# Patient Record
Sex: Female | Born: 1995
Health system: Southern US, Community
[De-identification: ages and names within clinical notes are randomized; demographics above are authoritative.]

## PROBLEM LIST (undated history)

## (undated) DIAGNOSIS — F418 Other specified anxiety disorders: Secondary | ICD-10-CM

## (undated) DIAGNOSIS — M357 Hypermobility syndrome: Secondary | ICD-10-CM

## (undated) DIAGNOSIS — G43109 Migraine with aura, not intractable, without status migrainosus: Secondary | ICD-10-CM

## (undated) DIAGNOSIS — M6751 Plica syndrome, right knee: Secondary | ICD-10-CM

## (undated) DIAGNOSIS — L700 Acne vulgaris: Secondary | ICD-10-CM

## (undated) DIAGNOSIS — N911 Secondary amenorrhea: Secondary | ICD-10-CM

## (undated) HISTORY — DX: Acne vulgaris: L70.0

## (undated) HISTORY — DX: Migraine with aura, not intractable, without status migrainosus: G43.109

## (undated) HISTORY — DX: Secondary amenorrhea: N91.1

## (undated) HISTORY — DX: Plica syndrome, right knee: M67.51

## (undated) HISTORY — DX: Other specified anxiety disorders: F41.8

## (undated) HISTORY — DX: Hypermobility syndrome: M35.7

---
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
## 2017-02-12 ENCOUNTER — Other Ambulatory Visit: Payer: Self-pay | Admitting: Physician Assistant

## 2017-02-12 MED ORDER — NORETHINDRONE 0.35 MG PO TABS: 1.0000 | ORAL_TABLET | Freq: Every day | ORAL | 3 refills | Status: DC

## 2017-02-12 MED ORDER — TRETINOIN MICROSPHERE 0.04 % EX GEL: Freq: Every day | CUTANEOUS | 1 refills | Status: DC

## 2017-05-12 DIAGNOSIS — N3001 Acute cystitis with hematuria: Secondary | ICD-10-CM | POA: Diagnosis not present

## 2017-05-13 ENCOUNTER — Other Ambulatory Visit: Payer: Self-pay | Admitting: Physician Assistant

## 2017-05-13 MED ORDER — PROMETHAZINE HCL 25 MG PO TABS: 25.0000 mg | ORAL_TABLET | Freq: Three times a day (TID) | ORAL | 1 refills | Status: DC | PRN

## 2017-06-29 DIAGNOSIS — N912 Amenorrhea, unspecified: Secondary | ICD-10-CM | POA: Diagnosis not present

## 2017-06-29 DIAGNOSIS — L709 Acne, unspecified: Secondary | ICD-10-CM | POA: Diagnosis not present

## 2017-07-06 DIAGNOSIS — Z30430 Encounter for insertion of intrauterine contraceptive device: Secondary | ICD-10-CM | POA: Diagnosis not present

## 2017-07-31 DIAGNOSIS — M545 Low back pain: Secondary | ICD-10-CM | POA: Diagnosis not present

## 2017-07-31 DIAGNOSIS — M25551 Pain in right hip: Secondary | ICD-10-CM | POA: Diagnosis not present

## 2017-09-07 ENCOUNTER — Ambulatory Visit: Payer: 59 | Admitting: Family Medicine

## 2017-09-07 ENCOUNTER — Ambulatory Visit (INDEPENDENT_AMBULATORY_CARE_PROVIDER_SITE_OTHER): Payer: 59

## 2017-09-07 ENCOUNTER — Encounter: Payer: Self-pay | Admitting: Family Medicine

## 2017-09-07 VITALS — BP 110/60 | HR 92 | Temp 98.6°F | Ht 62.0 in | Wt 108.4 lb

## 2017-09-07 DIAGNOSIS — N912 Amenorrhea, unspecified: Secondary | ICD-10-CM | POA: Diagnosis not present

## 2017-09-07 DIAGNOSIS — M25551 Pain in right hip: Secondary | ICD-10-CM

## 2017-09-07 DIAGNOSIS — E559 Vitamin D deficiency, unspecified: Secondary | ICD-10-CM

## 2017-09-07 DIAGNOSIS — M2550 Pain in unspecified joint: Secondary | ICD-10-CM

## 2017-09-07 DIAGNOSIS — M25552 Pain in left hip: Secondary | ICD-10-CM | POA: Diagnosis not present

## 2017-09-07 DIAGNOSIS — R5383 Other fatigue: Secondary | ICD-10-CM | POA: Diagnosis not present

## 2017-09-07 DIAGNOSIS — M25561 Pain in right knee: Secondary | ICD-10-CM | POA: Diagnosis not present

## 2017-09-07 LAB — POCT URINE PREGNANCY: Preg Test, Ur: NEGATIVE

## 2017-09-07 MED ORDER — MUPIROCIN CALCIUM 2 % EX CREA: TOPICAL_CREAM | Freq: Two times a day (BID) | CUTANEOUS | Status: AC

## 2017-09-07 NOTE — Progress Notes (Signed)
Leslie French is a 22 y.o. female here for an acute visit.  History of Present Illness:   Leslie French, CMA acting as scribe for Dr. Briscoe Deutscher.   HPI:She is in school in [place] and is home for the summer. She in the office with her mom who is a PA. Dad is a Tax adviser. Mom is concerned because she is not a big complainer.    Patient in office for evaluation of joint pain. Started around age 84 with knee pian has been evaluated in the past. It has spread all over body. Pain is in bilateral knee hip and ankle pain. With Right grater than left. Her right side hurts when she wakes up every day and left side starts as she moves during the day. She has been a runner in the past but pain has gotten to the point where she can't even walk a mile. The pain got to a point where it effecting daily activities about a year ago. When she was seen by sports medicine was told that she had hypermobility and it should not get much worse. She has not had any imaging, PT , or blood work done. She did have ultrasound done by sports medications. She has noticed that tight bracing helps a small amount. She has not noticed any swelling but does feel like she has had more accidents due to stability issues.   She was super active in the past but now she does not feel like she can even go on short walk. No rashes, or tick bites. She has had some trouble sleeping and increased fatigue. She is not able to stay in one position for more than 30 minutes at a time. It wakes her up at night.   She has not had changes in social habits. No drugs or alcohol. No family history arthritis issues. She has had history of migraines and abnormal menstrual cycle with problems with sleep disturbances.   She does have follow up with sports medicine next week for orthotics.   PMHx, SurgHx, SocialHx, Medications, and Allergies were reviewed in the Visit Navigator and updated as appropriate.  Current Medications:   Current Outpatient  Medications:  .  ibuprofen (ADVIL,MOTRIN) 200 MG tablet, Take 200 mg by mouth every 6 (six) hours as needed., Disp: , Rfl:   Current Facility-Administered Medications:  .  mupirocin cream (BACTROBAN) 2 %, , Topical, BID, Briscoe Deutscher, DO   Allergies  Allergen Reactions  . Cephalosporins Hives    As a child  . Amoxicillin Hives  . Cefdinir Hives and Swelling  . Penicillins Hives   Review of Systems:   Pertinent items are noted in the HPI. Otherwise, ROS is negative.  Vitals:   Vitals:   09/07/17 1453  BP: 110/60  Pulse: 92  Temp: 98.6 F (37 C)  TempSrc: Oral  SpO2: 99%  Weight: 108 lb 6.4 oz (49.2 kg)  Height: 5\' 2"  (1.575 m)     Body mass index is 19.83 kg/m.  Physical Exam:   Physical Exam  Constitutional: She appears well-nourished.  HENT:  Head: Normocephalic and atraumatic.  Eyes: Pupils are equal, round, and reactive to light. EOM are normal.  Neck: Normal range of motion. Neck supple.  Cardiovascular: Normal rate, regular rhythm, normal heart sounds and intact distal pulses.  Pulmonary/Chest: Effort normal.  Abdominal: Soft.  Skin: Skin is warm.  Psychiatric: She has a normal mood and affect. Her behavior is normal.  Nursing note and vitals reviewed.  Results for orders placed or performed in visit on 09/07/17  ANA  Result Value Ref Range   Anti Nuclear Antibody(ANA) NEGATIVE NEGATIVE  Sedimentation rate  Result Value Ref Range   Sed Rate 3 0 - 20 mm/hr  Cyclic citrul peptide antibody, IgG  Result Value Ref Range   Cyclic Citrullin Peptide Ab <16 UNITS  VITAMIN D 25 Hydroxy (Vit-D Deficiency, Fractures)  Result Value Ref Range   VITD 36.40 30.00 - 100.00 ng/mL  CBC with Differential/Platelet  Result Value Ref Range   WBC 5.8 4.5 - 13.5 K/uL   RBC 4.14 3.80 - 5.70 Mil/uL   Hemoglobin 12.8 12.0 - 16.0 g/dL   HCT 37.8 36.0 - 49.0 %   MCV 91.4 78.0 - 98.0 fl   MCHC 33.9 31.0 - 37.0 g/dL   RDW 12.3 11.4 - 15.5 %   Platelets 207.0 150.0 -  575.0 K/uL   Neutrophils Relative % 60.6 43.0 - 71.0 %   Lymphocytes Relative 32.5 24.0 - 48.0 %   Monocytes Relative 5.3 3.0 - 12.0 %   Eosinophils Relative 1.1 0.0 - 5.0 %   Basophils Relative 0.5 0.0 - 3.0 %   Neutro Abs 3.5 1.4 - 7.7 K/uL   Lymphs Abs 1.9 0.7 - 4.0 K/uL   Monocytes Absolute 0.3 0.1 - 1.0 K/uL   Eosinophils Absolute 0.1 0.0 - 0.7 K/uL   Basophils Absolute 0.0 0.0 - 0.1 K/uL  Comprehensive metabolic panel  Result Value Ref Range   Sodium 142 135 - 145 mEq/L   Potassium 4.2 3.5 - 5.1 mEq/L   Chloride 105 96 - 112 mEq/L   CO2 30 19 - 32 mEq/L   Glucose, Bld 97 70 - 99 mg/dL   BUN 9 6 - 23 mg/dL   Creatinine, Ser 0.60 0.40 - 1.20 mg/dL   Total Bilirubin 0.4 0.2 - 1.2 mg/dL   Alkaline Phosphatase 74 47 - 119 U/L   AST 14 0 - 37 U/L   ALT 13 0 - 35 U/L   Total Protein 6.7 6.0 - 8.3 g/dL   Albumin 4.5 3.5 - 5.2 g/dL   Calcium 9.3 8.4 - 10.5 mg/dL   GFR 135.57 >60.00 mL/min  TSH  Result Value Ref Range   TSH 1.21 0.40 - 5.00 uIU/mL  Vitamin B12  Result Value Ref Range   Vitamin B-12 599 211 - 911 pg/mL  Rheumatoid factor  Result Value Ref Range   Rhuematoid fact SerPl-aCnc <14 <14 IU/mL  C-reactive protein  Result Value Ref Range   CRP <0.1 (L) 0.5 - 20.0 mg/dL  POCT urine pregnancy  Result Value Ref Range   Preg Test, Ur Negative Negative   Assessment and Plan:   Leslie French was seen today for leg pain.  Diagnoses and all orders for this visit:  Arthralgia, unspecified joint -     ANA -     Sedimentation rate -     Cyclic citrul peptide antibody, IgG -     Cancel: DG HIP UNILAT W OR W/O PELVIS 2-3 VIEWS LEFT; Future -     VITAMIN D 25 Hydroxy (Vit-D Deficiency, Fractures) -     DG Knee 1-2 Views Right; Future -     mupirocin cream (BACTROBAN) 2 % -     CBC with Differential/Platelet -     Comprehensive metabolic panel -     TSH -     Vitamin B12 -     Rheumatoid factor -     C-reactive  protein  Other fatigue -     ANA -     Sedimentation  rate -     Cyclic citrul peptide antibody, IgG -     CBC with Differential/Platelet -     Comprehensive metabolic panel -     TSH -     Vitamin B12  Vitamin D deficiency -     VITAMIN D 25 Hydroxy (Vit-D Deficiency, Fractures)  Amenorrhea -     POCT urine pregnancy  Right hip pain -     DG HIP UNILAT W OR W/O PELVIS 2-3 VIEWS RIGHT   . Reviewed expectations re: course of current medical issues. . Discussed self-management of symptoms. . Outlined signs and symptoms indicating need for more acute intervention. . Patient verbalized understanding and all questions were answered. Marland Kitchen Health Maintenance issues including appropriate healthy diet, exercise, and smoking avoidance were discussed with patient. . See orders for this visit as documented in the electronic medical record. . Patient received an After Visit Summary.  CMA served as Education administrator during this visit. History, Physical, and Plan performed by medical provider. The above documentation has been reviewed and is accurate and complete. Briscoe Deutscher, D.O.  Briscoe Deutscher, DO [place], [facility] 09/10/2017

## 2017-09-08 LAB — COMPREHENSIVE METABOLIC PANEL
ALT: 13 U/L (ref 0–35)
AST: 14 U/L (ref 0–37)
Albumin: 4.5 g/dL (ref 3.5–5.2)
Alkaline Phosphatase: 74 U/L (ref 47–119)
BUN: 9 mg/dL (ref 6–23)
CO2: 30 mEq/L (ref 19–32)
Calcium: 9.3 mg/dL (ref 8.4–10.5)
Chloride: 105 mEq/L (ref 96–112)
Creatinine, Ser: 0.6 mg/dL (ref 0.40–1.20)
GFR: 135.57 mL/min (ref >60.00)
Glucose, Bld: 97 mg/dL (ref 70–99)
Potassium: 4.2 mEq/L (ref 3.5–5.1)
Sodium: 142 mEq/L (ref 135–145)
Total Bilirubin: 0.4 mg/dL (ref 0.2–1.2)
Total Protein: 6.7 g/dL (ref 6.0–8.3)

## 2017-09-08 LAB — CBC WITH DIFFERENTIAL/PLATELET
Basophils Absolute: 0 10*3/uL (ref 0.0–0.1)
Basophils Relative: 0.5 % (ref 0.0–3.0)
Eosinophils Absolute: 0.1 10*3/uL (ref 0.0–0.7)
Eosinophils Relative: 1.1 % (ref 0.0–5.0)
HCT: 37.8 % (ref 36.0–49.0)
Hemoglobin: 12.8 g/dL (ref 12.0–16.0)
Lymphocytes Relative: 32.5 % (ref 24.0–48.0)
Lymphs Abs: 1.9 10*3/uL (ref 0.7–4.0)
MCHC: 33.9 g/dL (ref 31.0–37.0)
MCV: 91.4 fl (ref 78.0–98.0)
Monocytes Absolute: 0.3 10*3/uL (ref 0.1–1.0)
Monocytes Relative: 5.3 % (ref 3.0–12.0)
Neutro Abs: 3.5 10*3/uL (ref 1.4–7.7)
Neutrophils Relative %: 60.6 % (ref 43.0–71.0)
Platelets: 207 10*3/uL (ref 150.0–575.0)
RBC: 4.14 Mil/uL (ref 3.80–5.70)
RDW: 12.3 % (ref 11.4–15.5)
WBC: 5.8 10*3/uL (ref 4.5–13.5)

## 2017-09-08 LAB — VITAMIN B12: Vitamin B-12: 599 pg/mL (ref 211–911)

## 2017-09-08 LAB — VITAMIN D 25 HYDROXY (VIT D DEFICIENCY, FRACTURES): VITD: 36.4 ng/mL (ref 30.00–100.00)

## 2017-09-08 LAB — TSH: TSH: 1.21 u[IU]/mL (ref 0.40–5.00)

## 2017-09-08 LAB — C-REACTIVE PROTEIN: CRP: <0.1 mg/dL — ABNORMAL LOW (ref 0.5–20.0)

## 2017-09-08 LAB — SEDIMENTATION RATE: Sed Rate: 3 mm/hr (ref 0–20)

## 2017-09-09 LAB — ANA: Anti Nuclear Antibody(ANA): NEGATIVE

## 2017-09-09 LAB — RHEUMATOID FACTOR: Rhuematoid fact SerPl-aCnc: <14 IU/mL (ref <14)

## 2017-09-09 LAB — CYCLIC CITRUL PEPTIDE ANTIBODY, IGG: Cyclic Citrullin Peptide Ab: <16 UNITS

## 2017-09-10 ENCOUNTER — Encounter: Payer: Self-pay | Admitting: Family Medicine

## 2017-09-14 ENCOUNTER — Ambulatory Visit: Payer: 59 | Admitting: Sports Medicine

## 2017-09-14 ENCOUNTER — Encounter: Payer: Self-pay | Admitting: Sports Medicine

## 2017-09-14 VITALS — BP 94/66 | Ht 62.0 in | Wt 110.0 lb

## 2017-09-14 DIAGNOSIS — M25551 Pain in right hip: Secondary | ICD-10-CM | POA: Diagnosis not present

## 2017-09-14 DIAGNOSIS — G8929 Other chronic pain: Secondary | ICD-10-CM | POA: Diagnosis not present

## 2017-09-14 DIAGNOSIS — M25561 Pain in right knee: Secondary | ICD-10-CM

## 2017-09-14 DIAGNOSIS — M25552 Pain in left hip: Secondary | ICD-10-CM

## 2017-09-14 MED ORDER — MUPIROCIN 2 % EX OINT: 1.0000 "application " | TOPICAL_OINTMENT | Freq: Two times a day (BID) | CUTANEOUS | 0 refills | Status: DC

## 2017-09-14 MED FILL — MUPIROCIN 2% OINTMENT: 2 | 10 days supply | Qty: 22 | Fill #0

## 2017-09-14 NOTE — Progress Notes (Signed)
   Subjective:    Patient ID: Leslie French, female    DOB: [birth year], 22 y.o.   MRN: [id number]  HPI chief complaint: Bilateral hip and knee pain  Very pleasant 22 year old female comes in today complaining of long-standing bilateral hip and knee pain. She's been diagnosed previously with hypermobility. She was last seen in the office by Dr. Oneida Alar in 2017 for right knee pain. She was given comprehensive home exercises at that time but her knee pain never improved. In fact, she used to be an avid runner but has had to stop running because of her pain. She now endorses pain with simple walking. Pain is in both hips and both knees. She saw her PCP recently and had blood work done to rule out systemic arthropathy. She also had x-rays of her knee and right hip. In regards to hip pain, she endorses pain throughout the hip with activity. Her knee pain is diffuse. She has not noticed any swelling. No surgery to either hip or knee in the past. She takes Motrin occasionally as needed. Otherwise is healthy. She is here today with her mom.  Interim medical history reviewed Medications reviewed Allergies reviewed    Review of Systems As above    Objective:   Physical Exam  Well-developed, well-nourished. No acute distress. Awake alert and oriented 3. Vital signs reviewed.  Examination of both hips shows smooth painless hip range of motion with a positive FADIR bilaterally, right greater than left. Positive "C" sign bilaterally. No tenderness to palpation. Fairly good strength.  Examination of both knees shows full range of motion. No obvious effusion. Good patellar mobility. No joint line tenderness. Good ligament stability.  Neurovascular intact distally.  Evaluation of her gait shows slight pronation.  X-rays of her right knee and right hip show nothing acute. 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 Given the chronicity of the right knee pain and normal x-rays I do think that it is reasonable to get an MRI to rule out an osteochondral lesion. In the meantime, we've given her some hip and knee exercises to start doing at home. I strongly recommended that she try Pilates. She lives in [place] but I gave her [place] card as a reference. Since she has slight pronation, we will fit her with some green sports insoles with scaphoid pads. I will call her with MRI results when available. Although she may have femoral acetabular impingement, I believe that this is likely secondary to hypermobility and I've recommended against aggressive workup or treatment for this at this point in time.

## 2017-09-18 ENCOUNTER — Ambulatory Visit
Admission: RE | Admit: 2017-09-18 | Discharge: 2017-09-18 | Disposition: A | Discharge status: Home / Self Care | Payer: 59 | Source: Ambulatory Visit | Attending: Sports Medicine | Admitting: Sports Medicine

## 2017-09-18 DIAGNOSIS — M25561 Pain in right knee: Secondary | ICD-10-CM | POA: Diagnosis not present

## 2017-09-18 DIAGNOSIS — G8929 Other chronic pain: Secondary | ICD-10-CM

## 2017-09-25 ENCOUNTER — Telehealth: Payer: Self-pay | Admitting: Sports Medicine

## 2017-09-25 NOTE — Telephone Encounter (Signed)
  I spoke with Leslie French on the phone today after reviewing the MRI of her right knee. She does have a markedly thickened and prominent medial plica but no evidence of chondromalacia patella.This may be an incidental finding as most of her symptoms appear to be retropatellar. I've recommended that she stick with her home exercises for now and follow-up with me when she returns home from college sometime this fall. If she fails to improve with strengthening of her hip and knee then we may need to consider surgical consultation to discuss merits of medial plica excision.

## 2017-11-30 ENCOUNTER — Ambulatory Visit: Payer: 59 | Admitting: Sports Medicine

## 2017-12-01 DIAGNOSIS — M6751 Plica syndrome, right knee: Secondary | ICD-10-CM

## 2017-12-01 DIAGNOSIS — G8929 Other chronic pain: Secondary | ICD-10-CM | POA: Diagnosis not present

## 2017-12-01 HISTORY — DX: Plica syndrome, right knee: M67.51

## 2017-12-02 DIAGNOSIS — Z23 Encounter for immunization: Secondary | ICD-10-CM | POA: Diagnosis not present

## 2017-12-02 DIAGNOSIS — Z87440 Personal history of urinary (tract) infections: Secondary | ICD-10-CM | POA: Diagnosis not present

## 2017-12-02 DIAGNOSIS — R319 Hematuria, unspecified: Secondary | ICD-10-CM | POA: Diagnosis not present

## 2017-12-10 ENCOUNTER — Ambulatory Visit: Payer: 59 | Admitting: Sports Medicine

## 2017-12-10 ENCOUNTER — Encounter: Payer: Self-pay | Admitting: Sports Medicine

## 2017-12-10 DIAGNOSIS — M6751 Plica syndrome, right knee: Secondary | ICD-10-CM

## 2017-12-10 NOTE — Assessment & Plan Note (Signed)
Patient had MRI which confirmed a thickened plica. She has been to see Dr. Ronnie Derby who recommended surgical release and removal of the ligament to help alleviate her knee pain. Given her hypermobility, we did discuss the risk of developing patella dislocation however she has never dislocated her patella before and given her continued pain which is now causing hip pain it is reasonable to pursue surgery as an option.  Patient will return to the clinic if her condition does not improve or if she has develops new symptoms. - Hip Adductor and Abductor stretches, Piriformis stretches, and hip flexor and extensor exercises

## 2017-12-10 NOTE — Progress Notes (Signed)
     Subjective:  HPI: Leslie French is a 22 y.o. presenting to clinic today to discuss the following:  Right Knee Pain Patient returns to clinic for follow up of right knee pain. Patient states her pain still continues and she has developed associated pain of the right hip. She states she has seen Dr. Ronnie Derby who recommended surgical release and removal of the plica. She had an MRI and is here to discuss those results and options for her going forward.  ROS no locking or giving way; no swelling of knee;  Sharp pain under sup medial patella  Past Medical, Surgical, Social, and Family History Reviewed & Updated per EMR.   Pertinent Historical Findings include:   Social History   Tobacco Use  Smoking Status Never Smoker  Smokeless Tobacco Never Used    Objective: BP 98/62   Ht 5\' 2"  (1.575 m)   Wt 110 lb (49.9 kg)   BMI 20.12 kg/m  Vitals and nursing notes reviewed  Physical Exam Gen: NAD, comfortable in exam room MSK: Beighton score of 5 Right Hip: No gross deformities, TTP in the piriformis region, pain on internal and external rotation, increased range of motion in flexion, extension, internal and external rotation, adduction and abduction. Negative FABER and FADIR test. Negative straight leg raise test. 5/5 strength. Gross sensation intact Left Hip: No gross deformities, TTP in the piriformis region, pain on internal and external rotation, increased range of motion in flexion, extension, internal and external rotation, adduction and abduction. Negative FABER and FADIR test. Negative straight leg raise test. 5/5 strength. Gross sensation intact. Right Knee: No gross deformities, non-tender to palpation, FROM painful when moving in flexion and internal rotation, 5/5 strength, gross sensation intact.  She has hypermobility but patella does not sublux on exam; no apprehension sign.  [place] screen confirms thick plica with no swelling noted at MPFL  Assessment/Plan:  Plica of  knee, right Patient had MRI which confirmed a thickened plica. She has been to see Dr. Ronnie Derby who recommended surgical release and removal of the ligament to help alleviate her knee pain. Given her hypermobility, we did discuss the risk of developing patella dislocation however she has never dislocated her patella before and given her continued pain which is now causing hip pain it is reasonable to pursue surgery as an option.  Patient will return to the clinic if her condition does not improve or if she has develops new symptoms. - Hip Adductor and Abductor stretches, Piriformis stretches, and hip flexor and extensor exercises   PATIENT EDUCATION PROVIDED: See AVS    Diagnosis and plan along with any newly prescribed medication(s) were discussed in detail with this patient today. The patient verbalized understanding and agreed with the plan. Patient advised if symptoms worsen return to clinic or ER.    Harolyn Rutherford, DO 12/10/2017, 4:35 PM PGY-2 [facility] I observed and examined the patient with the resident and agree with assessment and plan.  Note reviewed and modified by me. Stefanie Libel, MD

## 2017-12-10 NOTE — Patient Instructions (Signed)
It was great to meet you today! Thank you for letting me participate in your care!  Today, we discussed your continued right knee pain that is due to a thickened ligament in your knee joint called the medial patellofemoral ligament. Given your continued pain and the results of your MRI scan it is reasonable to pursue surgery at this time. We will also provide you with some hip exercises to help reduce your hip pain which is in part due to a change in your activity due to your knee pain. We wish you the best going forward in surgery. Please return to the clinic if you have any concerns or your condition does not improve.  Be well, Harolyn Rutherford, DO PGY-2, [facility]

## 2018-01-17 HISTORY — PX: OTHER SURGICAL HISTORY: SHX169

## 2018-01-27 DIAGNOSIS — G8918 Other acute postprocedural pain: Secondary | ICD-10-CM | POA: Diagnosis not present

## 2018-01-27 DIAGNOSIS — M6751 Plica syndrome, right knee: Secondary | ICD-10-CM | POA: Diagnosis not present

## 2018-01-27 DIAGNOSIS — M659 Synovitis and tenosynovitis, unspecified: Secondary | ICD-10-CM | POA: Diagnosis not present

## 2018-02-02 DIAGNOSIS — M25561 Pain in right knee: Secondary | ICD-10-CM | POA: Diagnosis not present

## 2018-02-02 DIAGNOSIS — Z9889 Other specified postprocedural states: Secondary | ICD-10-CM | POA: Diagnosis not present

## 2018-02-02 DIAGNOSIS — R262 Difficulty in walking, not elsewhere classified: Secondary | ICD-10-CM | POA: Diagnosis not present

## 2018-02-02 DIAGNOSIS — R29898 Other symptoms and signs involving the musculoskeletal system: Secondary | ICD-10-CM | POA: Diagnosis not present

## 2018-02-02 DIAGNOSIS — G8929 Other chronic pain: Secondary | ICD-10-CM | POA: Diagnosis not present

## 2018-02-02 DIAGNOSIS — M6751 Plica syndrome, right knee: Secondary | ICD-10-CM | POA: Diagnosis not present

## 2018-02-15 ENCOUNTER — Other Ambulatory Visit: Payer: Self-pay | Admitting: Sports Medicine

## 2018-02-18 MED FILL — MUPIROCIN 2% OINTMENT: 2 | 5 days supply | Qty: 22 | Fill #0

## 2018-04-26 ENCOUNTER — Encounter: Payer: Self-pay | Admitting: Podiatry

## 2018-04-26 ENCOUNTER — Other Ambulatory Visit: Payer: Self-pay | Admitting: Podiatry

## 2018-04-26 ENCOUNTER — Ambulatory Visit (INDEPENDENT_AMBULATORY_CARE_PROVIDER_SITE_OTHER): Payer: 59

## 2018-04-26 ENCOUNTER — Ambulatory Visit: Payer: 59 | Admitting: Podiatry

## 2018-04-26 VITALS — BP 87/54

## 2018-04-26 DIAGNOSIS — M2010 Hallux valgus (acquired), unspecified foot: Secondary | ICD-10-CM

## 2018-04-26 DIAGNOSIS — M2011 Hallux valgus (acquired), right foot: Secondary | ICD-10-CM

## 2018-04-26 DIAGNOSIS — L600 Ingrowing nail: Secondary | ICD-10-CM

## 2018-04-26 DIAGNOSIS — M21619 Bunion of unspecified foot: Secondary | ICD-10-CM

## 2018-04-26 DIAGNOSIS — M2012 Hallux valgus (acquired), left foot: Secondary | ICD-10-CM

## 2018-04-26 NOTE — Patient Instructions (Signed)
Bunion  A bunion is a bump on the base of the big toe that forms when the bones of the big toe joint move out of position. Bunions may be small at first, but they often get larger over time. They can make walking painful. What are the causes? A bunion may be caused by:  Wearing narrow or pointed shoes that force the big toe to press against the other toes.  Abnormal foot development that causes the foot to roll inward (pronate).  Changes in the foot that are caused by certain diseases, such as rheumatoid arthritis or polio.  A foot injury. What increases the risk? The following factors may make you more likely to develop this condition:  Wearing shoes that squeeze the toes together.  Having certain diseases, such as: ? Rheumatoid arthritis. ? Polio. ? Cerebral palsy.  Having family members who have bunions.  Being born with a foot deformity, such as flat feet or low arches.  Doing activities that put a lot of pressure on the feet, such as ballet dancing. What are the signs or symptoms? The main symptom of a bunion is a noticeable bump on the big toe. Other symptoms may include:  Pain.  Swelling around the big toe.  Redness and inflammation.  Thick or hardened skin on the big toe or between the toes.  Stiffness or loss of motion in the big toe.  Trouble with walking. How is this diagnosed? A bunion may be diagnosed based on your symptoms, medical history, and activities. You may have tests, such as:  X-rays. These allow your health care provider to check the position of the bones in your foot and look for damage to your joint. They also help your health care provider determine the severity of your bunion and the best way to treat it.  Joint aspiration. In this test, a sample of fluid is removed from the toe joint. This test may be done if you are in a lot of pain. It helps rule out diseases that cause painful swelling of the joints, such as arthritis. How is this  treated? Treatment depends on the severity of your symptoms. The goal of treatment is to relieve symptoms and prevent the bunion from getting worse. Your health care provider may recommend:  Wearing shoes that have a wide toe box.  Using bunion pads to cushion the affected area.  Taping your toes together to keep them in a normal position.  Placing a device inside your shoe (orthotics) to help reduce pressure on your toe joint.  Taking medicine to ease pain, inflammation, and swelling.  Applying heat or ice to the affected area.  Doing stretching exercises.  Surgery to remove scar tissue and move the toes back into their normal position. This treatment is rare. Follow these instructions at home: Managing pain, stiffness, and swelling   If directed, put ice on the painful area: ? Put ice in a plastic bag. ? Place a towel between your skin and the bag. ? Leave the ice on for 20 minutes, 2-3 times a day. Activity   If directed, apply heat to the affected area before you exercise. Use the heat source that your health care provider recommends, such as a moist heat pack or a heating pad. ? Place a towel between your skin and the heat source. ? Leave the heat on for 20-30 minutes. ? Remove the heat if your skin turns bright red. This is especially important if you are unable to feel pain,   heat, or cold. You may have a greater risk of getting burned.  Do exercises as told by your health care provider. General instructions  Support your toe joint with proper footwear, shoe padding, or taping as told by your health care provider.  Take over-the-counter and prescription medicines only as told by your health care provider.  Keep all follow-up visits as told by your health care provider. This is important. Contact a health care provider if your symptoms:  Get worse.  Do not improve in 2 weeks. Get help right away if you have:  Severe pain and trouble with walking. Summary  A  bunion is a bump on the base of the big toe that forms when the bones of the big toe joint move out of position.  Bunions can make walking painful.  Treatment depends on the severity of your symptoms.  Support your toe joint with proper footwear, shoe padding, or taping as told by your health care provider. This information is not intended to replace advice given to you by your health care provider. Make sure you discuss any questions you have with your health care provider. Document Released: 02/03/2005 Document Revised: 06/16/2017 Document Reviewed: 06/16/2017 Elsevier Interactive Patient Education  2019 [employer]  

## 2018-04-26 NOTE — Progress Notes (Signed)
Subjective:   Patient ID: Leslie French, female   DOB: 23 y.o.   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  States the bunions been very sore and making it hard for her to walk and she is tried different treatment options and wider shoes without relief of symptoms.  Patient does not smoke likes to be active   Review of Systems  All other systems reviewed and are negative.       Objective:  Physical Exam Vitals signs and nursing note reviewed.  Constitutional:      Appearance: She is well-developed.  Pulmonary:     Effort: Pulmonary effort is normal.  Musculoskeletal: Normal range of motion.  Skin:    General: Skin is warm.  Neurological:     Mental Status: She is alert.     Neurovascular status intact muscle strength is adequate range of motion within normal limits with patient found to have exquisite discomfort in the first metatarsal right with redness in the joint surface with palpation with no loss of motion.  Patient is found to have incurvated hallux nails bilateral that have been chronic in their nature and making it increasingly difficult to wear shoe gear without discomfort.  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  At this time I did discuss correction of deformity and recommended that we do this prior to any surgery and that she could have the knee surgery done within 3 to 4 weeks  after the procedure and we will do the right one bunion lies in the ingrown toenail left and then can do the ingrown toenail a couple weeks later.  Patient is scheduled for consult they will look at their schedule and decide the appropriate time for the procedure  X-ray indicates elevation of the intermetatarsal angle right over left

## 2018-06-23 ENCOUNTER — Telehealth: Payer: Self-pay | Admitting: *Deleted

## 2018-06-23 NOTE — Telephone Encounter (Signed)
"  She saw him very recently. Will you ask him if we need to do that (consultation) or if we can come by just to pick up instructions and go ahead and schedule the surgery?"  Thanks, Freeman Caldron, PA-C ------------------------------------------------------------------------------------------------------------------------------------------------------------  Dr. Paulla Dolly has not gone over the consent forms with her.  She has to sign a three page consent form, he will review the procedure, give her a surgical kit, schedule her surgery date, and give her pre-operative instructions during that visit.

## 2018-06-23 NOTE — Telephone Encounter (Signed)
"  I am wondering when you guys will be scheduling surgeries again? Dr Paulla Dolly is supposed to do bunion surgery on my daughter, and we would like to get it done as early as possible once things are back to being scheduled."  Her name is:  Leslie French  DOB 21-Mar-1995   Thanks, Freeman Caldron, PA-C _________________________________________________________________________________________________  I responded to her email and informed her that her daughter needs to come in for a consultation with Dr. Paulla Dolly.  We are doing elective surgeries.

## 2018-06-28 ENCOUNTER — Other Ambulatory Visit: Payer: Self-pay

## 2018-06-28 ENCOUNTER — Encounter: Payer: Self-pay | Admitting: Podiatry

## 2018-06-28 ENCOUNTER — Ambulatory Visit: Payer: 59 | Admitting: Podiatry

## 2018-06-28 VITALS — Temp 97.5°F

## 2018-06-28 DIAGNOSIS — L600 Ingrowing nail: Secondary | ICD-10-CM | POA: Diagnosis not present

## 2018-06-28 DIAGNOSIS — M21611 Bunion of right foot: Secondary | ICD-10-CM

## 2018-06-28 DIAGNOSIS — D169 Benign neoplasm of bone and articular cartilage, unspecified: Secondary | ICD-10-CM | POA: Diagnosis not present

## 2018-06-28 DIAGNOSIS — M21619 Bunion of unspecified foot: Secondary | ICD-10-CM

## 2018-06-28 NOTE — Progress Notes (Signed)
Subjective:   Patient ID: Leslie French, female   DOB: 23 y.o.   MRN: [id number]   HPI Patient presents stating that her bunion right is ready to be faxed and also she has a bump on top of the right and left foot that makes it hard to wear shoe gear comfortably and she has chronic ingrown toenails of both sides of the big toes both feet.  She presents for consult for structural correction   ROS      Objective:  Physical Exam  Neurovascular status intact with patient found to have bunion deformity right over left with patient also noted to have a prominence of the first metatarsal cuneiform joint right and left that is irritated and hard to wear shoe gear with.  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  I recommended a combination of distal osteotomy right first metatarsal tarsal exostectomy right and removal of ingrown toenail left.  Patient wants surgery and I went ahead and I reviewed consent form and surgery with patient going over all possible complications of the surgery with the patient.  She is willing to accept the risk wants surgery and at this point signed consent form after 6 significant review of it going over all possible complications and the fact total recovery can take 6 months to 1 year.  Patient had air fracture walker dispensed at the current time and is encouraged to call with questions prior to procedure in the next few weeks

## 2018-06-28 NOTE — Patient Instructions (Signed)
Pre-Operative Instructions  Congratulations, you have decided to take an important step towards improving your quality of life.  You can be assured that the doctors and staff at [facility] will be with you every step of the way.  Here are some important things you should know:  1. Plan to be at the surgery center/hospital at least 1 (one) hour prior to your scheduled time, unless otherwise directed by the surgical center/hospital staff.  You must have a responsible adult accompany you, remain during the surgery and drive you home.  Make sure you have directions to the surgical center/hospital to ensure you arrive on time. 2. If you are having surgery at [facility] or [facility] hospitals, you will need a copy of your medical history and physical form from your family physician within one month prior to the date of surgery. We will give you a form for your primary physician to complete.  3. We make every effort to accommodate the date you request for surgery.  However, there are times where surgery dates or times have to be moved.  We will contact you as soon as possible if a change in schedule is required.   4. No aspirin/ibuprofen for one week before surgery.  If you are on aspirin, any non-steroidal anti-inflammatory medications (Mobic, Aleve, Ibuprofen) should not be taken seven (7) days prior to your surgery.  You make take Tylenol for pain prior to surgery.  5. Medications - If you are taking daily heart and blood pressure medications, seizure, reflux, allergy, asthma, anxiety, pain or diabetes medications, make sure you notify the surgery center/hospital before the day of surgery so they can tell you which medications you should take or avoid the day of surgery. 6. No food or drink after midnight the night before surgery unless directed otherwise by surgical center/hospital staff. 7. No alcoholic beverages 24-hours prior to surgery.  No smoking 24-hours prior or 24-hours after  surgery. 8. Wear loose pants or shorts. They should be loose enough to fit over bandages, boots, and casts. 9. Don't wear slip-on shoes. Sneakers are preferred. 10. Bring your boot with you to the surgery center/hospital.  Also bring crutches or a walker if your physician has prescribed it for you.  If you do not have this equipment, it will be provided for you after surgery. 11. If you have not been contacted by the surgery center/hospital by the day before your surgery, call to confirm the date and time of your surgery. 12. Leave-time from work may vary depending on the type of surgery you have.  Appropriate arrangements should be made prior to surgery with your employer. 13. Prescriptions will be provided immediately following surgery by your doctor.  Fill these as soon as possible after surgery and take the medication as directed. Pain medications will not be refilled on weekends and must be approved by the doctor. 14. Remove nail polish on the operative foot and avoid getting pedicures prior to surgery. 15. Wash the night before surgery.  The night before surgery wash the foot and leg well with water and the antibacterial soap provided. Be sure to pay special attention to beneath the toenails and in between the toes.  Wash for at least three (3) minutes. Rinse thoroughly with water and dry well with a towel.  Perform this wash unless told not to do so by your physician.  Enclosed: 1 Ice pack (please put in freezer the night before surgery)   1 Hibiclens skin cleaner     Pre-op instructions  If you have any questions regarding the instructions, please do not hesitate to call our office.  [place]: [address], [place], [place] [address] -- [phone]  [place]: [address] [place], [place] [address] -- [phone]  [place]: [address]  [place], [place] [address] -- [phone]  [place]: [address], [address], [place], [place] [address] -- [phone]  Website:  [email or web address] 

## 2018-07-13 ENCOUNTER — Telehealth: Payer: Self-pay | Admitting: Podiatry

## 2018-07-13 NOTE — Telephone Encounter (Signed)
Pt is scheduled for bunion surgery on her right foot next week and was supposed to have an ingrown toenail removed on the left foot as well. Pt has decided she does not want to have the ingrown removed at this time. Please notify Dr. Paulla Dolly and let the surgical center know of the change as well.

## 2018-07-14 NOTE — Telephone Encounter (Signed)
Took out and documented in [employer]. Also marked through in surgery book.

## 2018-07-20 ENCOUNTER — Encounter: Payer: Self-pay | Admitting: Podiatry

## 2018-07-20 DIAGNOSIS — M2011 Hallux valgus (acquired), right foot: Secondary | ICD-10-CM | POA: Diagnosis not present

## 2018-07-20 DIAGNOSIS — Z008 Encounter for other general examination: Secondary | ICD-10-CM | POA: Diagnosis not present

## 2018-07-20 DIAGNOSIS — M25774 Osteophyte, right foot: Secondary | ICD-10-CM | POA: Diagnosis not present

## 2018-07-20 MED FILL — ONDANSETRON HCL 4 MG TABLET: 4 | 5 days supply | Qty: 15 | Fill #0

## 2018-07-20 MED FILL — OXYCODONE-APAP 10-325 TAB: 10-325 | 4 days supply | Qty: 25 | Fill #0

## 2018-07-26 ENCOUNTER — Ambulatory Visit (INDEPENDENT_AMBULATORY_CARE_PROVIDER_SITE_OTHER): Payer: 59

## 2018-07-26 ENCOUNTER — Ambulatory Visit (INDEPENDENT_AMBULATORY_CARE_PROVIDER_SITE_OTHER): Payer: 59 | Admitting: Podiatry

## 2018-07-26 ENCOUNTER — Encounter: Payer: Self-pay | Admitting: Podiatry

## 2018-07-26 ENCOUNTER — Other Ambulatory Visit: Payer: Self-pay

## 2018-07-26 VITALS — Temp 98.1°F

## 2018-07-26 DIAGNOSIS — M21611 Bunion of right foot: Secondary | ICD-10-CM

## 2018-07-26 DIAGNOSIS — M21619 Bunion of unspecified foot: Secondary | ICD-10-CM

## 2018-07-26 DIAGNOSIS — D169 Benign neoplasm of bone and articular cartilage, unspecified: Secondary | ICD-10-CM

## 2018-07-26 DIAGNOSIS — Z09 Encounter for follow-up examination after completed treatment for conditions other than malignant neoplasm: Secondary | ICD-10-CM

## 2018-07-28 NOTE — Progress Notes (Signed)
Subjective:   Patient ID: Leslie French, female   DOB: 23 y.o.   MRN: [id number]   HPI Patient states she is doing really well with her surgery and very pleased with minimal discomfort   ROS      Objective:  Physical Exam  Neurovascular status intact with patient's right foot healing well wound edges well coapted hallux in rectus position and dorsal foot incision healing well with no drainage good range of motion with no crepitus at first MPJ noted     Assessment:  Doing well post foot surgery right     Plan:  H&P condition reviewed and recommended continued immobilization and elevation.  I reapplied sterile dressing after reviewing x-rays and instructed her how to work with this and patient will be seen back in 3 weeks for routine follow-up of surgery or earlier if any issues were to occur  X-ray indicates that the fixation first metatarsal looks excellent with good reduction of the ankle and good structural position

## 2018-08-09 ENCOUNTER — Other Ambulatory Visit: Payer: Self-pay

## 2018-08-09 ENCOUNTER — Ambulatory Visit (INDEPENDENT_AMBULATORY_CARE_PROVIDER_SITE_OTHER): Payer: 59

## 2018-08-09 ENCOUNTER — Encounter: Payer: Self-pay | Admitting: Podiatry

## 2018-08-09 ENCOUNTER — Ambulatory Visit (INDEPENDENT_AMBULATORY_CARE_PROVIDER_SITE_OTHER): Payer: 59 | Admitting: Podiatry

## 2018-08-09 DIAGNOSIS — D169 Benign neoplasm of bone and articular cartilage, unspecified: Secondary | ICD-10-CM | POA: Diagnosis not present

## 2018-08-09 DIAGNOSIS — M21619 Bunion of unspecified foot: Secondary | ICD-10-CM

## 2018-08-09 DIAGNOSIS — Z09 Encounter for follow-up examination after completed treatment for conditions other than malignant neoplasm: Secondary | ICD-10-CM

## 2018-08-09 DIAGNOSIS — M21611 Bunion of right foot: Secondary | ICD-10-CM

## 2018-08-10 NOTE — Progress Notes (Signed)
Subjective:   Patient ID: Leslie French, female   DOB: 23 y.o.   MRN: [id number]   HPI Patient states doing well with right foot and ready to slowly get back into activities   ROS      Objective:  Physical Exam  Neurovascular status intact negative Homans sign noted patient's right foot healing well wound edges well coapted hallux in rectus position dorsal incision healing well     Assessment:  Doing well post forefoot surgery right     Plan:  H&P conditions reviewed and at this point I recommended continued range of motion exercises continued rigid bottom shoes and gradual return to soft shoe gear.  Reappoint 4 weeks or earlier if needed  X-ray indicates the osteotomy is healing well joint congruence in good alignment with fixation in place signed visit

## 2018-09-06 ENCOUNTER — Ambulatory Visit: Payer: 59

## 2018-09-14 DIAGNOSIS — M25521 Pain in right elbow: Secondary | ICD-10-CM | POA: Diagnosis not present

## 2018-10-20 DIAGNOSIS — R200 Anesthesia of skin: Secondary | ICD-10-CM | POA: Diagnosis not present

## 2018-10-21 DIAGNOSIS — M25521 Pain in right elbow: Secondary | ICD-10-CM | POA: Diagnosis not present

## 2019-01-19 DIAGNOSIS — Z23 Encounter for immunization: Secondary | ICD-10-CM | POA: Diagnosis not present

## 2019-03-02 ENCOUNTER — Ambulatory Visit: Payer: 59 | Attending: Internal Medicine

## 2019-03-02 DIAGNOSIS — Z20822 Contact with and (suspected) exposure to covid-19: Secondary | ICD-10-CM | POA: Diagnosis not present

## 2019-03-03 DIAGNOSIS — Z20828 Contact with and (suspected) exposure to other viral communicable diseases: Secondary | ICD-10-CM | POA: Diagnosis not present

## 2019-03-03 LAB — NOVEL CORONAVIRUS, NAA: SARS-CoV-2, NAA: DETECTED — AB

## 2019-03-31 DIAGNOSIS — R300 Dysuria: Secondary | ICD-10-CM | POA: Diagnosis not present

## 2019-06-22 DIAGNOSIS — Z3202 Encounter for pregnancy test, result negative: Secondary | ICD-10-CM | POA: Diagnosis not present

## 2019-06-22 DIAGNOSIS — Z30431 Encounter for routine checking of intrauterine contraceptive device: Secondary | ICD-10-CM | POA: Diagnosis not present

## 2019-07-03 IMAGING — MR MR KNEE*R* W/O CM
4 of 7 series · 19 of 40 positions shown · non-contrast
Comparison: Plain films right knee 09/07/2017.

CLINICAL DATA: Right knee pain and popping posterior to the patella
for 5 years in a runner. No known injury.

EXAM:
MRI OF THE RIGHT KNEE WITHOUT CONTRAST
TECHNIQUE: Multiplanar, multisequence MR imaging of the knee was performed. No
intravenous contrast was administered.

[Series 4: T2 fat-sat · axial · 4.0mm · 0.31mm/px · z∈[+0,+110]mm · 4 of 32 slices shown]
[im 1/32]
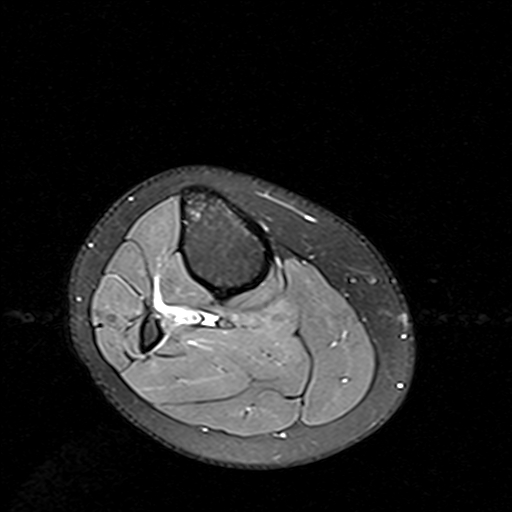
[im 6/32]
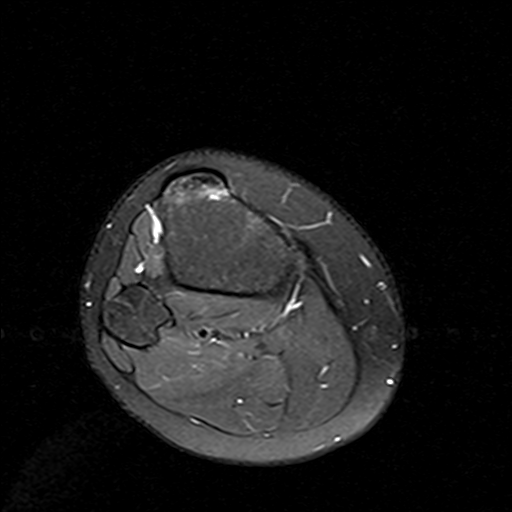
[im 16/32]
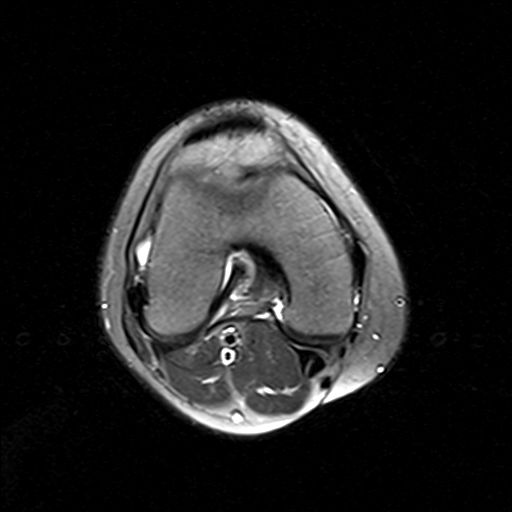
[im 26/32]
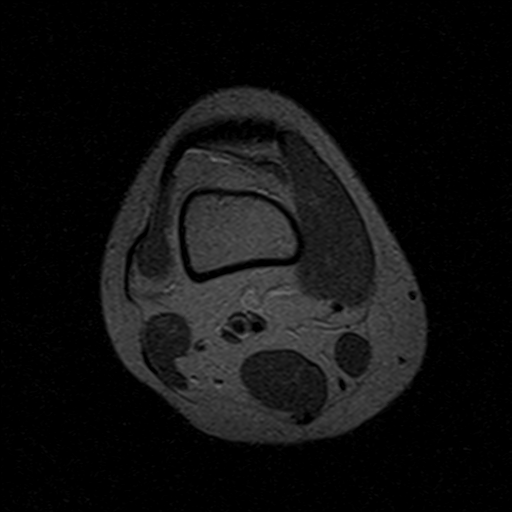

[Series 6: PD fat-sat · sagittal · 3.0mm · 0.29mm/px · 6 of 30 slices shown (1 of 3)]
[im 1/30]
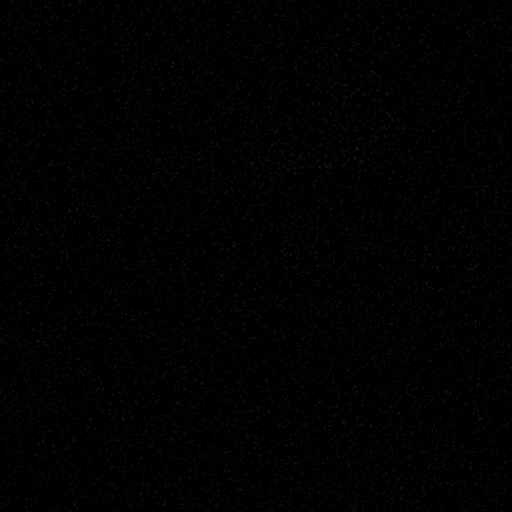
[im 6/30]
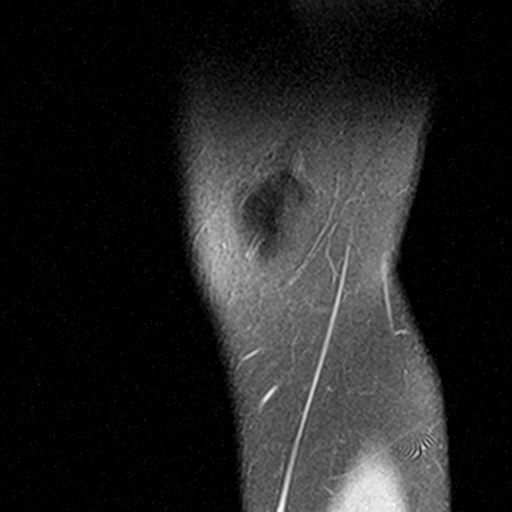
[im 12/30]
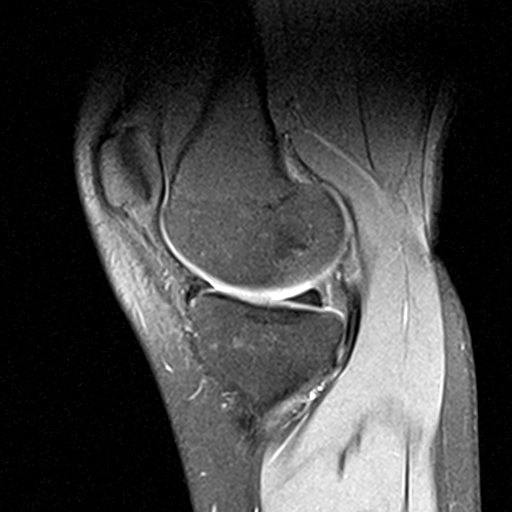
[im 18/30]
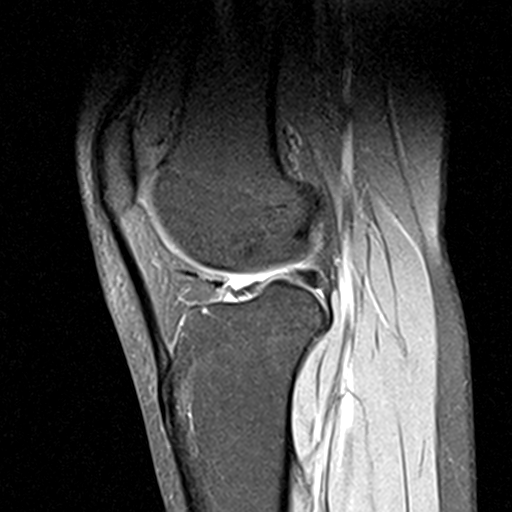
[im 24/30]
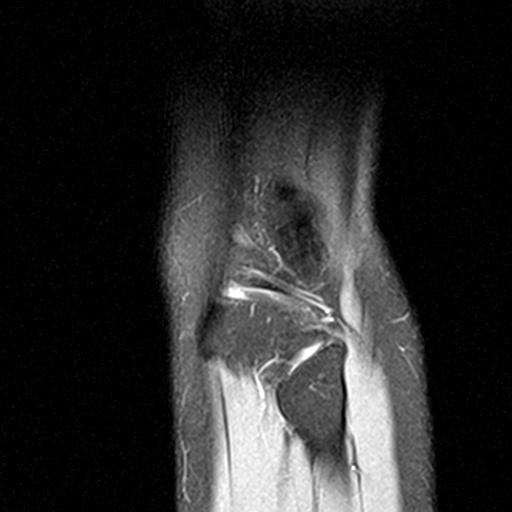
[im 30/30]
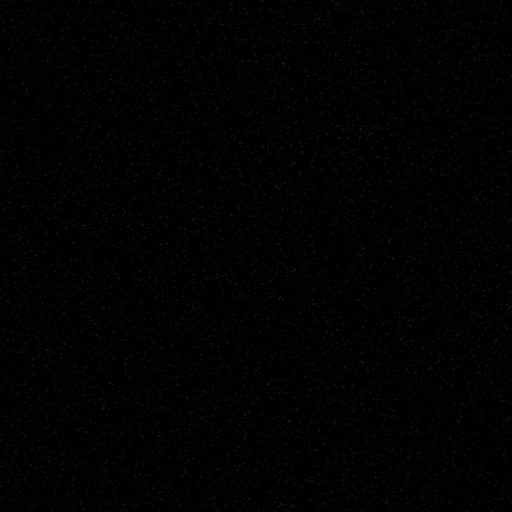

[Series 8: PD fat-sat · coronal · 4.0mm · 0.29mm/px · 6 of 27 slices shown (2 of 3)]
[im 1/27]
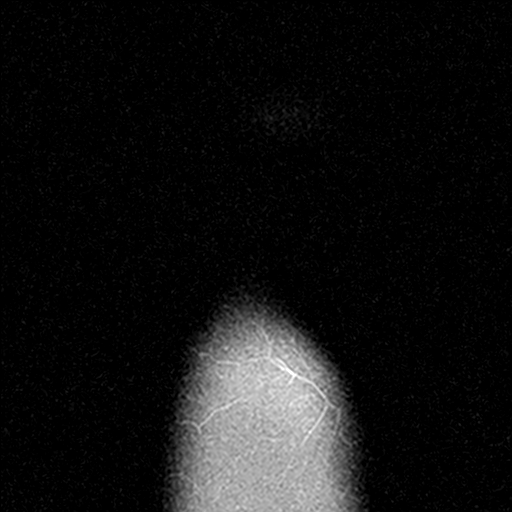
[im 6/27]
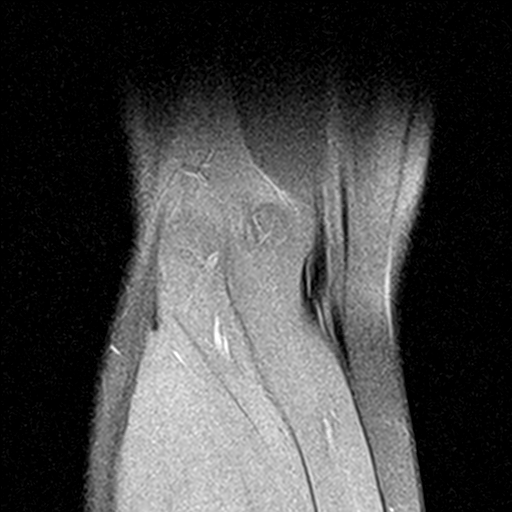
[im 11/27]
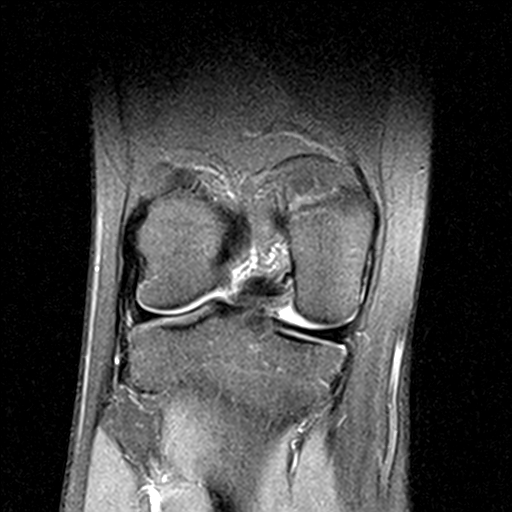
[im 16/27]
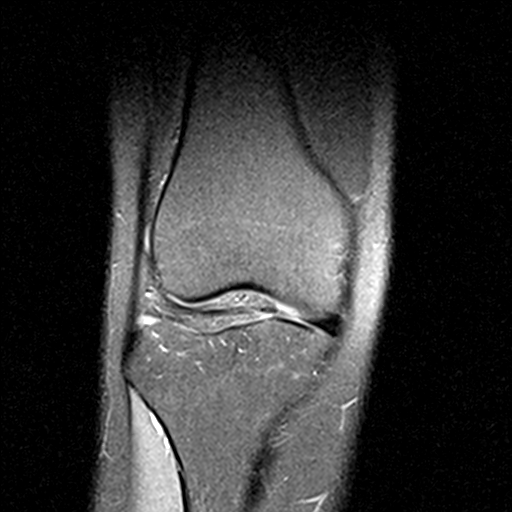
[im 21/27]
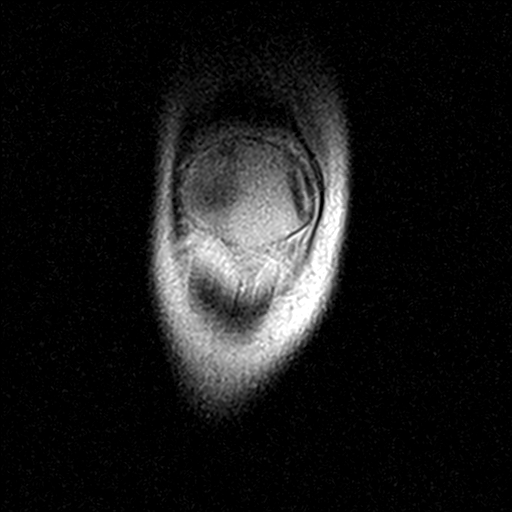
[im 27/27]
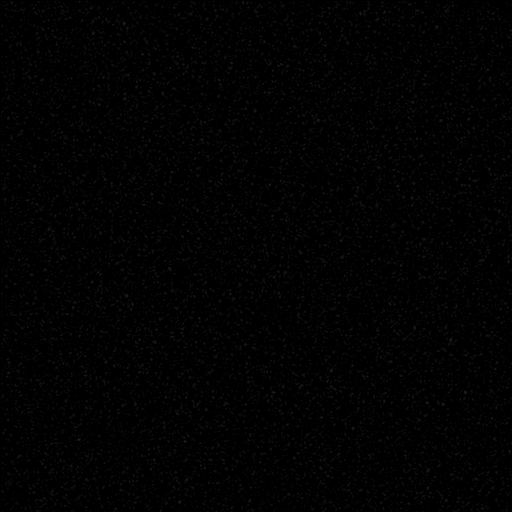

[Series 10: PD fat-sat · oblique · 2.0mm · 0.29mm/px · 3 of 16 slices shown (3 of 3)]
[im 1/16]
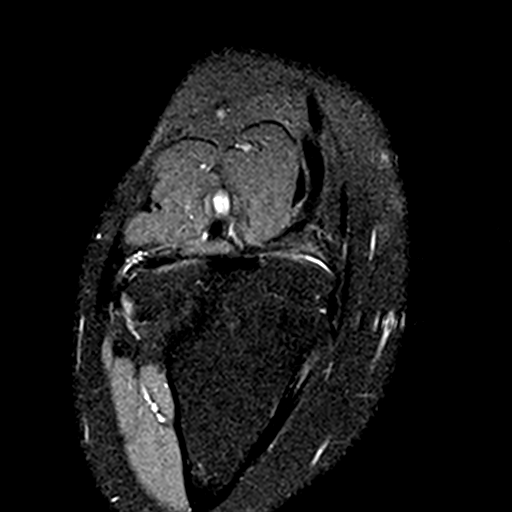
[im 8/16]
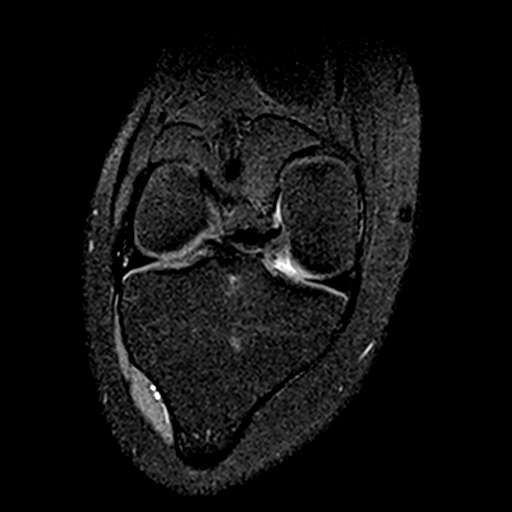
[im 16/16]
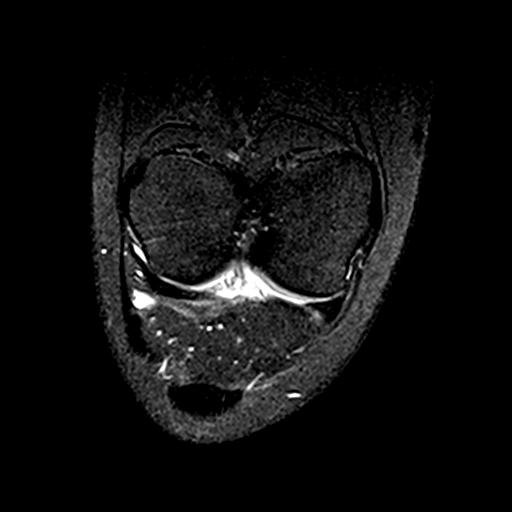

[19 of 40 positions shown; findings below may reference images not displayed]

FINDINGS: MENISCI

Medial meniscus:  Intact.

Lateral meniscus:  Intact

LIGAMENTS

Cruciates:  Intact.

Collaterals:  Intact.

CARTILAGE

Patellofemoral: Preserved. Markedly thickened and prominent medial
plica is noted.

Medial:  Preserved.

Lateral:  Preserved.

Joint:  No effusion.

Popliteal Fossa:  No Baker's cyst.

Extensor Mechanism:  Intact.

Bones:  Normal marrow signal throughout.

Other: None.
IMPRESSION: Markedly thickened and prominent medial plica without chondromalacia
patella. The examination is otherwise negative. No meniscal or
ligament tear is seen.

## 2019-07-04 DIAGNOSIS — Z23 Encounter for immunization: Secondary | ICD-10-CM | POA: Diagnosis not present

## 2019-09-15 DIAGNOSIS — Z20822 Contact with and (suspected) exposure to covid-19: Secondary | ICD-10-CM | POA: Diagnosis not present

## 2019-09-27 DIAGNOSIS — Z3202 Encounter for pregnancy test, result negative: Secondary | ICD-10-CM | POA: Diagnosis not present

## 2019-09-27 DIAGNOSIS — Z3009 Encounter for other general counseling and advice on contraception: Secondary | ICD-10-CM | POA: Diagnosis not present

## 2019-10-31 ENCOUNTER — Other Ambulatory Visit (HOSPITAL_COMMUNITY): Payer: Self-pay | Admitting: Orthopedic Surgery

## 2019-10-31 DIAGNOSIS — M25521 Pain in right elbow: Secondary | ICD-10-CM | POA: Diagnosis not present

## 2019-10-31 MED FILL — GABAPENTIN 300 MG CAPSULE: 300 | 30 days supply | Qty: 90 | Fill #0

## 2019-11-01 DIAGNOSIS — M25562 Pain in left knee: Secondary | ICD-10-CM | POA: Diagnosis not present

## 2019-11-22 DIAGNOSIS — G5601 Carpal tunnel syndrome, right upper limb: Secondary | ICD-10-CM | POA: Diagnosis not present

## 2019-11-22 DIAGNOSIS — M25521 Pain in right elbow: Secondary | ICD-10-CM | POA: Diagnosis not present

## 2019-11-28 MED FILL — GABAPENTIN 300 MG CAPSULE: 300 | 30 days supply | Qty: 90 | Fill #1

## 2019-12-16 DIAGNOSIS — M25521 Pain in right elbow: Secondary | ICD-10-CM | POA: Diagnosis not present

## 2020-01-02 DIAGNOSIS — Z20822 Contact with and (suspected) exposure to covid-19: Secondary | ICD-10-CM | POA: Diagnosis not present

## 2020-01-02 DIAGNOSIS — Z03818 Encounter for observation for suspected exposure to other biological agents ruled out: Secondary | ICD-10-CM | POA: Diagnosis not present

## 2020-01-07 ENCOUNTER — Other Ambulatory Visit (HOSPITAL_COMMUNITY): Payer: Self-pay | Admitting: Orthopedic Surgery

## 2020-01-09 MED FILL — GABAPENTIN 300 MG CAPSULE: 300 | 30 days supply | Qty: 90 | Fill #0

## 2020-01-17 MED FILL — GABAPENTIN 300 MG CAPSULE: 300 | 30 days supply | Qty: 90 | Fill #0

## 2020-01-25 ENCOUNTER — Other Ambulatory Visit (HOSPITAL_COMMUNITY): Payer: Self-pay | Admitting: Orthopedic Surgery

## 2020-01-25 DIAGNOSIS — M659 Synovitis and tenosynovitis, unspecified: Secondary | ICD-10-CM | POA: Diagnosis not present

## 2020-01-25 DIAGNOSIS — M6752 Plica syndrome, left knee: Secondary | ICD-10-CM | POA: Diagnosis not present

## 2020-01-25 DIAGNOSIS — M65862 Other synovitis and tenosynovitis, left lower leg: Secondary | ICD-10-CM | POA: Diagnosis not present

## 2020-01-25 DIAGNOSIS — M94262 Chondromalacia, left knee: Secondary | ICD-10-CM | POA: Diagnosis not present

## 2020-01-25 DIAGNOSIS — G8918 Other acute postprocedural pain: Secondary | ICD-10-CM | POA: Diagnosis not present

## 2020-01-25 MED FILL — oxyCODONE HCL 5 MG TABS: 5 | 2 days supply | Qty: 10 | Fill #0

## 2020-01-25 MED FILL — IBUPROFEN 600 MG TABLET: 600 | 8 days supply | Qty: 30 | Fill #0

## 2020-01-31 ENCOUNTER — Other Ambulatory Visit (HOSPITAL_COMMUNITY): Payer: Self-pay | Admitting: Orthopedic Surgery

## 2020-01-31 DIAGNOSIS — Z9889 Other specified postprocedural states: Secondary | ICD-10-CM | POA: Diagnosis not present

## 2020-01-31 DIAGNOSIS — R29898 Other symptoms and signs involving the musculoskeletal system: Secondary | ICD-10-CM | POA: Diagnosis not present

## 2020-01-31 MED FILL — PROMETHAZINE 25 MG TABLET: 25 | 8 days supply | Qty: 30 | Fill #0

## 2020-02-07 MED FILL — NORLYDA 0.35 MG TABS: 0.35 | 84 days supply | Qty: 84 | Fill #0

## 2020-03-02 DIAGNOSIS — Z20822 Contact with and (suspected) exposure to covid-19: Secondary | ICD-10-CM | POA: Diagnosis not present

## 2020-04-30 DIAGNOSIS — Z3202 Encounter for pregnancy test, result negative: Secondary | ICD-10-CM | POA: Diagnosis not present

## 2020-04-30 DIAGNOSIS — R102 Pelvic and perineal pain: Secondary | ICD-10-CM | POA: Diagnosis not present

## 2020-05-04 ENCOUNTER — Other Ambulatory Visit (HOSPITAL_COMMUNITY): Payer: Self-pay | Admitting: Orthopedic Surgery

## 2020-05-07 MED FILL — GABAPENTIN 300 MG CAPSULE: 300 | 30 days supply | Qty: 90 | Fill #0

## 2020-06-06 ENCOUNTER — Other Ambulatory Visit (HOSPITAL_COMMUNITY): Payer: Self-pay

## 2020-06-06 MED FILL — Gabapentin Cap 300 MG: ORAL | 30 days supply | Qty: 90 | Fill #0 | Status: AC

## 2020-06-12 ENCOUNTER — Other Ambulatory Visit (HOSPITAL_COMMUNITY): Payer: Self-pay

## 2020-07-05 ENCOUNTER — Other Ambulatory Visit (HOSPITAL_COMMUNITY): Payer: Self-pay

## 2020-07-05 MED FILL — Gabapentin Cap 300 MG: ORAL | 30 days supply | Qty: 90 | Fill #1 | Status: CN

## 2020-07-10 ENCOUNTER — Other Ambulatory Visit (HOSPITAL_COMMUNITY): Payer: Self-pay

## 2020-07-10 MED FILL — Gabapentin Cap 300 MG: ORAL | 30 days supply | Qty: 90 | Fill #1 | Status: AC

## 2020-07-17 ENCOUNTER — Other Ambulatory Visit (HOSPITAL_COMMUNITY): Payer: Self-pay

## 2020-08-03 ENCOUNTER — Other Ambulatory Visit (HOSPITAL_COMMUNITY): Payer: Self-pay

## 2020-08-03 DIAGNOSIS — G5601 Carpal tunnel syndrome, right upper limb: Secondary | ICD-10-CM | POA: Diagnosis not present

## 2020-08-03 MED ORDER — GABAPENTIN 300 MG PO CAPS: ORAL_CAPSULE | ORAL | 0 refills | Status: DC

## 2020-08-03 MED ORDER — GABAPENTIN 300 MG PO CAPS
300.0000 mg | ORAL_CAPSULE | Freq: Three times a day (TID) | ORAL | 0 refills | Status: AC
  Filled 2020-08-03 – 2020-08-17 (×2): qty 270, 90d supply, fill #0

## 2020-08-13 ENCOUNTER — Other Ambulatory Visit (HOSPITAL_COMMUNITY): Payer: Self-pay

## 2020-08-13 MED ORDER — ALPRAZOLAM 0.5 MG PO TABS
0.5000 mg | ORAL_TABLET | ORAL | 0 refills | Status: AC
  Filled 2020-08-13: qty 1, 1d supply, fill #0

## 2020-08-17 ENCOUNTER — Other Ambulatory Visit (HOSPITAL_COMMUNITY): Payer: Self-pay

## 2020-08-17 DIAGNOSIS — K116 Mucocele of salivary gland: Secondary | ICD-10-CM | POA: Diagnosis not present

## 2020-08-17 DIAGNOSIS — K1120 Sialoadenitis, unspecified: Secondary | ICD-10-CM | POA: Diagnosis not present

## 2020-08-17 MED ORDER — IBUPROFEN 400 MG PO TABS
400.0000 mg | ORAL_TABLET | ORAL | 0 refills | Status: AC | PRN
  Filled 2020-08-17: qty 30, 5d supply, fill #0

## 2020-08-17 MED ORDER — HYDROCODONE-ACETAMINOPHEN 5-325 MG PO TABS
1.0000 | ORAL_TABLET | ORAL | 0 refills | Status: AC | PRN
  Filled 2020-08-17: qty 5, 1d supply, fill #0

## 2020-08-17 MED ORDER — LIDOCAINE VISCOUS HCL 2 % MT SOLN
5.0000 mL | OROMUCOSAL | 0 refills | Status: AC
  Filled 2020-08-17: qty 200, 13d supply, fill #0

## 2020-08-24 ENCOUNTER — Other Ambulatory Visit (HOSPITAL_COMMUNITY): Payer: Self-pay

## 2020-09-27 DIAGNOSIS — M5412 Radiculopathy, cervical region: Secondary | ICD-10-CM | POA: Diagnosis not present

## 2020-09-27 DIAGNOSIS — M542 Cervicalgia: Secondary | ICD-10-CM | POA: Diagnosis not present

## 2020-10-23 DIAGNOSIS — Z23 Encounter for immunization: Secondary | ICD-10-CM | POA: Diagnosis not present

## 2020-10-23 DIAGNOSIS — Z3041 Encounter for surveillance of contraceptive pills: Secondary | ICD-10-CM | POA: Diagnosis not present

## 2020-10-23 DIAGNOSIS — Z6820 Body mass index (BMI) 20.0-20.9, adult: Secondary | ICD-10-CM | POA: Diagnosis not present

## 2020-10-26 DIAGNOSIS — G43111 Migraine with aura, intractable, with status migrainosus: Secondary | ICD-10-CM | POA: Diagnosis not present

## 2020-10-26 DIAGNOSIS — M47812 Spondylosis without myelopathy or radiculopathy, cervical region: Secondary | ICD-10-CM | POA: Diagnosis not present

## 2020-10-26 DIAGNOSIS — M9901 Segmental and somatic dysfunction of cervical region: Secondary | ICD-10-CM | POA: Diagnosis not present

## 2020-10-26 DIAGNOSIS — M2550 Pain in unspecified joint: Secondary | ICD-10-CM | POA: Diagnosis not present

## 2020-10-26 DIAGNOSIS — M47817 Spondylosis without myelopathy or radiculopathy, lumbosacral region: Secondary | ICD-10-CM | POA: Diagnosis not present

## 2020-10-26 DIAGNOSIS — R200 Anesthesia of skin: Secondary | ICD-10-CM | POA: Diagnosis not present

## 2020-10-26 DIAGNOSIS — M438X6 Other specified deforming dorsopathies, lumbar region: Secondary | ICD-10-CM | POA: Diagnosis not present

## 2020-10-26 DIAGNOSIS — M542 Cervicalgia: Secondary | ICD-10-CM | POA: Diagnosis not present

## 2020-10-26 DIAGNOSIS — M9905 Segmental and somatic dysfunction of pelvic region: Secondary | ICD-10-CM | POA: Diagnosis not present

## 2020-10-29 DIAGNOSIS — M438X6 Other specified deforming dorsopathies, lumbar region: Secondary | ICD-10-CM | POA: Diagnosis not present

## 2020-10-29 DIAGNOSIS — M47812 Spondylosis without myelopathy or radiculopathy, cervical region: Secondary | ICD-10-CM | POA: Diagnosis not present

## 2020-10-29 DIAGNOSIS — M2550 Pain in unspecified joint: Secondary | ICD-10-CM | POA: Diagnosis not present

## 2020-10-29 DIAGNOSIS — G43111 Migraine with aura, intractable, with status migrainosus: Secondary | ICD-10-CM | POA: Diagnosis not present

## 2020-10-29 DIAGNOSIS — M9901 Segmental and somatic dysfunction of cervical region: Secondary | ICD-10-CM | POA: Diagnosis not present

## 2020-10-29 DIAGNOSIS — R200 Anesthesia of skin: Secondary | ICD-10-CM | POA: Diagnosis not present

## 2020-10-29 DIAGNOSIS — M47817 Spondylosis without myelopathy or radiculopathy, lumbosacral region: Secondary | ICD-10-CM | POA: Diagnosis not present

## 2020-10-29 DIAGNOSIS — M542 Cervicalgia: Secondary | ICD-10-CM | POA: Diagnosis not present

## 2020-10-29 DIAGNOSIS — M9905 Segmental and somatic dysfunction of pelvic region: Secondary | ICD-10-CM | POA: Diagnosis not present

## 2020-11-03 MED FILL — Gabapentin Cap 300 MG: ORAL | 30 days supply | Qty: 90 | Fill #2 | Status: CN

## 2020-11-05 ENCOUNTER — Other Ambulatory Visit (HOSPITAL_COMMUNITY): Payer: Self-pay

## 2020-11-05 MED ORDER — GABAPENTIN 300 MG PO CAPS
300.0000 mg | ORAL_CAPSULE | Freq: Three times a day (TID) | ORAL | 0 refills | Status: AC
  Filled 2020-11-05 – 2020-11-15 (×2): qty 270, 90d supply, fill #0

## 2020-11-05 MED FILL — Gabapentin Cap 300 MG: ORAL | 30 days supply | Qty: 90 | Fill #2 | Status: CN

## 2020-11-06 ENCOUNTER — Other Ambulatory Visit (HOSPITAL_COMMUNITY): Payer: Self-pay

## 2020-11-15 ENCOUNTER — Other Ambulatory Visit (HOSPITAL_COMMUNITY): Payer: Self-pay

## 2020-12-24 DIAGNOSIS — R5383 Other fatigue: Secondary | ICD-10-CM | POA: Diagnosis not present

## 2020-12-24 DIAGNOSIS — Z1321 Encounter for screening for nutritional disorder: Secondary | ICD-10-CM | POA: Diagnosis not present

## 2021-01-01 DIAGNOSIS — Z30011 Encounter for initial prescription of contraceptive pills: Secondary | ICD-10-CM | POA: Diagnosis not present

## 2021-01-22 ENCOUNTER — Other Ambulatory Visit (HOSPITAL_COMMUNITY): Payer: Self-pay

## 2021-01-22 ENCOUNTER — Other Ambulatory Visit: Payer: Self-pay | Admitting: Internal Medicine

## 2021-01-22 MED ORDER — OSELTAMIVIR PHOSPHATE 75 MG PO CAPS
75.0000 mg | ORAL_CAPSULE | Freq: Every day | ORAL | 0 refills | Status: DC
  Filled 2021-01-22: qty 5, 5d supply, fill #0

## 2021-01-22 MED ORDER — OSELTAMIVIR PHOSPHATE 75 MG PO CAPS
75.0000 mg | ORAL_CAPSULE | Freq: Two times a day (BID) | ORAL | 0 refills | Status: AC
  Filled 2021-01-22: qty 10, 5d supply, fill #0

## 2021-02-14 ENCOUNTER — Other Ambulatory Visit (HOSPITAL_COMMUNITY): Payer: Self-pay

## 2021-02-14 MED FILL — Gabapentin Cap 300 MG: ORAL | 30 days supply | Qty: 90 | Fill #2 | Status: AC

## 2021-02-18 ENCOUNTER — Other Ambulatory Visit (HOSPITAL_COMMUNITY): Payer: Self-pay

## 2021-02-18 MED ORDER — GABAPENTIN 300 MG PO CAPS
300.0000 mg | ORAL_CAPSULE | Freq: Three times a day (TID) | ORAL | 0 refills | Status: AC
  Filled 2021-02-18 – 2021-03-12 (×4): qty 270, 90d supply, fill #0

## 2021-03-11 ENCOUNTER — Other Ambulatory Visit (HOSPITAL_COMMUNITY): Payer: Self-pay

## 2021-03-12 ENCOUNTER — Other Ambulatory Visit (HOSPITAL_COMMUNITY): Payer: Self-pay

## 2021-06-21 ENCOUNTER — Other Ambulatory Visit (HOSPITAL_COMMUNITY): Payer: Self-pay

## 2021-06-21 MED ORDER — GABAPENTIN 300 MG PO CAPS
300.0000 mg | ORAL_CAPSULE | Freq: Three times a day (TID) | ORAL | 0 refills | Status: AC
  Filled 2021-06-21: qty 270, 90d supply, fill #0

## 2021-06-24 ENCOUNTER — Other Ambulatory Visit (HOSPITAL_COMMUNITY): Payer: Self-pay

## 2021-09-23 ENCOUNTER — Other Ambulatory Visit (HOSPITAL_COMMUNITY): Payer: Self-pay

## 2021-09-23 MED ORDER — GABAPENTIN 300 MG PO CAPS
300.0000 mg | ORAL_CAPSULE | Freq: Three times a day (TID) | ORAL | 0 refills | Status: AC
  Filled 2021-09-23: qty 270, 90d supply, fill #0

## 2021-11-11 DIAGNOSIS — Z681 Body mass index (BMI) 19 or less, adult: Secondary | ICD-10-CM | POA: Diagnosis not present

## 2021-11-11 DIAGNOSIS — Z23 Encounter for immunization: Secondary | ICD-10-CM | POA: Diagnosis not present

## 2021-11-11 DIAGNOSIS — J029 Acute pharyngitis, unspecified: Secondary | ICD-10-CM | POA: Diagnosis not present

## 2021-11-11 DIAGNOSIS — Z03818 Encounter for observation for suspected exposure to other biological agents ruled out: Secondary | ICD-10-CM | POA: Diagnosis not present

## 2021-12-25 ENCOUNTER — Other Ambulatory Visit (HOSPITAL_COMMUNITY): Payer: Self-pay

## 2021-12-25 MED ORDER — GABAPENTIN 300 MG PO CAPS
300.0000 mg | ORAL_CAPSULE | Freq: Three times a day (TID) | ORAL | 0 refills | Status: DC
  Filled 2021-12-25: qty 270, 90d supply, fill #0

## 2021-12-27 ENCOUNTER — Other Ambulatory Visit (HOSPITAL_COMMUNITY): Payer: Self-pay

## 2022-03-26 ENCOUNTER — Other Ambulatory Visit (HOSPITAL_COMMUNITY): Payer: Self-pay

## 2022-03-26 MED ORDER — GABAPENTIN 300 MG PO CAPS
300.0000 mg | ORAL_CAPSULE | Freq: Three times a day (TID) | ORAL | 0 refills | Status: AC
  Filled 2022-03-26: qty 270, 90d supply, fill #0

## 2022-07-11 ENCOUNTER — Other Ambulatory Visit (HOSPITAL_COMMUNITY): Payer: Self-pay

## 2022-07-11 MED ORDER — GABAPENTIN 300 MG PO CAPS
300.0000 mg | ORAL_CAPSULE | Freq: Three times a day (TID) | ORAL | 0 refills | Status: DC
  Filled 2022-07-11: qty 270, 90d supply, fill #0

## 2022-07-15 ENCOUNTER — Other Ambulatory Visit (HOSPITAL_COMMUNITY): Payer: Self-pay

## 2022-10-09 ENCOUNTER — Other Ambulatory Visit (HOSPITAL_COMMUNITY): Payer: Self-pay

## 2022-10-09 MED ORDER — GABAPENTIN 300 MG PO CAPS
300.0000 mg | ORAL_CAPSULE | Freq: Three times a day (TID) | ORAL | 0 refills | Status: DC
  Filled 2022-10-09: qty 270, 90d supply, fill #0

## 2022-10-15 ENCOUNTER — Other Ambulatory Visit (HOSPITAL_COMMUNITY): Payer: Self-pay

## 2022-11-12 DIAGNOSIS — M5412 Radiculopathy, cervical region: Secondary | ICD-10-CM | POA: Diagnosis not present

## 2022-11-12 DIAGNOSIS — M542 Cervicalgia: Secondary | ICD-10-CM | POA: Diagnosis not present

## 2022-11-12 DIAGNOSIS — M79601 Pain in right arm: Secondary | ICD-10-CM | POA: Diagnosis not present

## 2023-01-05 ENCOUNTER — Other Ambulatory Visit (HOSPITAL_COMMUNITY): Payer: Self-pay

## 2023-01-05 MED ORDER — GABAPENTIN 300 MG PO CAPS
300.0000 mg | ORAL_CAPSULE | Freq: Three times a day (TID) | ORAL | 0 refills | Status: DC
  Filled 2023-01-05: qty 270, 90d supply, fill #0

## 2023-03-20 DIAGNOSIS — Z30011 Encounter for initial prescription of contraceptive pills: Secondary | ICD-10-CM | POA: Diagnosis not present

## 2023-04-09 ENCOUNTER — Other Ambulatory Visit (HOSPITAL_COMMUNITY): Payer: Self-pay

## 2023-04-09 MED ORDER — GABAPENTIN 300 MG PO CAPS
300.0000 mg | ORAL_CAPSULE | Freq: Three times a day (TID) | ORAL | 0 refills | Status: AC
  Filled 2023-04-09 – 2023-04-21 (×2): qty 270, 90d supply, fill #0

## 2023-04-21 ENCOUNTER — Other Ambulatory Visit (HOSPITAL_COMMUNITY): Payer: Self-pay

## 2023-06-02 DIAGNOSIS — F649 Gender identity disorder, unspecified: Secondary | ICD-10-CM | POA: Diagnosis not present

## 2023-07-21 ENCOUNTER — Other Ambulatory Visit (HOSPITAL_COMMUNITY): Payer: Self-pay

## 2023-07-21 MED ORDER — GABAPENTIN 300 MG PO CAPS
300.0000 mg | ORAL_CAPSULE | Freq: Three times a day (TID) | ORAL | 0 refills | Status: DC
  Filled 2023-07-21: qty 270, 90d supply, fill #0

## 2023-09-03 ENCOUNTER — Ambulatory Visit: Payer: Commercial Managed Care - PPO | Admitting: Dermatology

## 2023-09-03 ENCOUNTER — Encounter: Payer: Self-pay | Admitting: Dermatology

## 2023-09-03 VITALS — BP 100/72

## 2023-09-03 DIAGNOSIS — D229 Melanocytic nevi, unspecified: Secondary | ICD-10-CM

## 2023-09-03 DIAGNOSIS — L905 Scar conditions and fibrosis of skin: Secondary | ICD-10-CM

## 2023-09-03 DIAGNOSIS — L910 Hypertrophic scar: Secondary | ICD-10-CM

## 2023-09-03 DIAGNOSIS — L700 Acne vulgaris: Secondary | ICD-10-CM

## 2023-09-03 MED ORDER — ALTRENO 0.05 % EX LOTN: 1.0000 | TOPICAL_LOTION | CUTANEOUS | 3 refills | Status: AC

## 2023-09-03 MED ORDER — CLINDAMYCIN PHOS-BENZOYL PEROX 1.2-3.75 % EX GEL: 1.0000 | Freq: Every morning | CUTANEOUS | 3 refills | Status: AC

## 2023-09-03 MED ORDER — SPIRONOLACTONE 100 MG PO TABS: 100.0000 mg | ORAL_TABLET | Freq: Every day | ORAL | 3 refills | Status: DC

## 2023-09-03 NOTE — Patient Instructions (Addendum)
 Date: Thu Sep 03 2023  Hello Leslie French,  Thank you for visiting today. Here is a summary of the key instructions:  - Medications:   - Spironolactone  100mg  daily with dinner   - Onexton (clindamycin  and benzoyl peroxide) every morning     - Apply a pea-sized amount to the whole face   - Altreno  (retinoid) at night     - Start with one night a week for 2-3 weeks     - After 2-3 weeks, increase to two nights a week  - Skincare Routine:   - Use a gentle foaming cleanser like CeraVe Cream to Foam   - Apply Vichy hyaluronic acid after cleansing   - On Altreno  nights:     1. Cleanse face     2. Apply hyaluronic acid     3. Apply pea-sized amount of Altreno      4. Apply Avene Cyclo-Fate Recovery Balm   - On non-Altreno  nights:     1. Cleanse face     2. Apply hyaluronic acid     3. Apply Avene Cyclo-Fate Recovery Balm  - Follow-up: Return for follow-up appointment in 4 months  - Additional Instructions:   - Do not keep benzoyl peroxide products in hot places (like a car)   - Stop all topical acne medications and spironolactone  if planning pregnancy   - Contact the office if Onexton or Altreno  cost is higher than expected  Please reach out if you have any questions or concerns.  Warm regards,  Dr. [facility]        Important Information  Due to recent changes in healthcare laws, you may see results of your pathology and/or laboratory studies on MyChart before the doctors have had a chance to review them. We understand that in some cases there may be results that are confusing or concerning to you. Please understand that not all results are received at the same time and often the doctors may need to interpret multiple results in order to provide you with the best plan of care or course of treatment. Therefore, we ask that you please give us  2 business days to thoroughly review all your results before contacting the office for clarification. Should we see a  critical lab result, you will be contacted sooner.   If You Need Anything After Your Visit  If you have any questions or concerns for your doctor, please call our main line at [phone] If no one answers, please leave a voicemail as directed and we will return your call as soon as possible. Messages left after 4 pm will be answered the following business day.   You may also send us  a message via MyChart. We typically respond to MyChart messages within 1-2 business days.  For prescription refills, please ask your pharmacy to contact our office. Our fax number is [phone].  If you have an urgent issue when the clinic is closed that cannot wait until the next business day, you can page your doctor at the number below.    Please note that while we do our best to be available for urgent issues outside of office hours, we are not available 24/7.   If you have an urgent issue and are unable to reach us , you may choose to seek medical care at your doctor's office, retail clinic, urgent care center, or emergency room.  If you have a medical emergency, please immediately call 911 or go to the emergency department. In the event  of inclement weather, please call our main line at [phone] for an update on the status of any delays or closures.  Dermatology Medication Tips: Please keep the boxes that topical medications come in in order to help keep track of the instructions about where and how to use these. Pharmacies typically print the medication instructions only on the boxes and not directly on the medication tubes.   If your medication is too expensive, please contact our office at [phone] or send us  a message through MyChart.   We are unable to tell what your co-pay for medications will be in advance as this is different depending on your insurance coverage. However, we may be able to find a substitute medication at lower cost or fill out paperwork to get insurance to cover a needed  medication.   If a prior authorization is required to get your medication covered by your insurance company, please allow us  1-2 business days to complete this process.  Drug prices often vary depending on where the prescription is filled and some pharmacies may offer cheaper prices.  The website www.goodrx.com contains coupons for medications through different pharmacies. The prices here do not account for what the cost may be with help from insurance (it may be cheaper with your insurance), but the website can give you the price if you did not use any insurance.  - You can print the associated coupon and take it with your prescription to the pharmacy.  - You may also stop by our office during regular business hours and pick up a GoodRx coupon card.  - If you need your prescription sent electronically to a different pharmacy, notify our office through [facility] or by phone at [phone]

## 2023-09-03 NOTE — Progress Notes (Signed)
   New Patient Visit   Subjective  Leslie French is a 28 y.o. female who presents for the following: Acne since her early teenage years that has gotten worse and more persistent as she has gotten older. She has tried OTC benzoyl peroxide, sal acid and azelaic acid. She has tried tretinoin  gel and cream in the past but acne did not improve and she had a lot of redness. She did try doxycycline when she was ~15 but had side effects (GI upset, weight gain, water retention). She also has a mole on each arm and beside her belly button that have changed.    The following portions of the chart were reviewed this encounter and updated as appropriate: medications, allergies, medical history  Review of Systems:  No other skin or systemic complaints except as noted in HPI or Assessment and Plan.  Objective  Well appearing patient in no apparent distress; mood and affect are within normal limits.   A focused examination was performed of the following areas: Face   Relevant exam findings are noted in the Assessment and Plan.                 Assessment & Plan   1. Acne Vulgaris and Acne Scarring  - Assessment: Long-standing history of acne since age 45 with cystic components and resultant scarring. Previous treatments included benzoyl peroxide wash, salicylic acid wash, tretinoin  (caused severe dryness and worsening of cystic acne), and azelaic acid (discontinued after one month). Patient reports oily skin that becomes irritated easily. Current examination reveals active acne lesions with evidence of cystic scarring.  - Plan:    Initiate spironolactone  (oral medication) with food, preferably with dinner    Prescribe Onexton (clindamycin /benzoyl peroxide combination) for morning use     - Apply pea-sized amount to entire face daily after gentle cleansing    Prescribe Altreno  (tretinoin  formulation) for nighttime use     - Start with once weekly application for 2-3 weeks, then increase  to twice weekly     - Apply pea-sized amount to entire face on treatment nights    Recommend CeraVe Cream to Foam cleanser for daily use    Advise use of Vichy hyaluronic acid serum before Altreno  application    Recommend Avene Cyclo-Fate Recovery Balm after Altreno  on treatment nights and alone on non-treatment nights    Discontinue current benzoyl peroxide wash    Follow up in 4 months to assess response and consider Accutane if not improved    Informed patient of potential initial worsening (purging) with new treatments and importance of adherence for at least 3-4 months    Advised to avoid pregnancy while on spironolactone  and topical treatments  2. Multiple Benign Appearing Nevi - Assessment: Multiple moles examined, including one within a tattoo and a new mole. All moles demonstrated benign characteristics upon dermoscopic examination. - Plan:    Continue monitoring moles for changes    No immediate intervention required    Return in about 4 months (around 01/04/2024) for Acne.  I, Roseline Hutchinson, CMA, am acting as scribe for [employer], DO .   Documentation: I have reviewed the above documentation for accuracy and completeness, and I agree with the above.  Delon Lenis, DO

## 2023-09-29 ENCOUNTER — Encounter: Payer: Self-pay | Admitting: Dermatology

## 2023-09-29 DIAGNOSIS — L700 Acne vulgaris: Secondary | ICD-10-CM

## 2023-09-29 MED ORDER — SPIRONOLACTONE 100 MG PO TABS: 100.0000 mg | ORAL_TABLET | Freq: Every day | ORAL | 1 refills | Status: AC

## 2023-10-16 ENCOUNTER — Other Ambulatory Visit (HOSPITAL_COMMUNITY): Payer: Self-pay

## 2023-10-16 MED ORDER — GABAPENTIN 300 MG PO CAPS
300.0000 mg | ORAL_CAPSULE | Freq: Three times a day (TID) | ORAL | 0 refills | Status: DC
  Filled 2023-10-16: qty 270, 90d supply, fill #0

## 2024-01-07 ENCOUNTER — Ambulatory Visit: Admitting: Dermatology

## 2024-01-07 ENCOUNTER — Encounter: Payer: Self-pay | Admitting: Physician Assistant

## 2024-01-07 ENCOUNTER — Ambulatory Visit (INDEPENDENT_AMBULATORY_CARE_PROVIDER_SITE_OTHER): Admitting: Physician Assistant

## 2024-01-07 VITALS — BP 96/67

## 2024-01-07 DIAGNOSIS — L905 Scar conditions and fibrosis of skin: Secondary | ICD-10-CM | POA: Diagnosis not present

## 2024-01-07 DIAGNOSIS — L700 Acne vulgaris: Secondary | ICD-10-CM

## 2024-01-07 NOTE — Progress Notes (Signed)
   Follow-Up Visit   Subjective  Leslie French is a 28 y.o. female established patient who presents for FOLLOW UP on the diagnoses listed below:  Patient was last evaluated on 09/03/23.   Acne: Patient has been taking spirolactone nightly with dinner, using Onexton QAM, and applying Altrento 1-2 nights a week. She was previously applying the Altreno  3 nights but it was too drying so she cut back. Patient stated out of the OTC product recommendations she has only purchased the Vichy hyaluronic acid and Avene cicalfate balm. She is using CeraVe moisturizer w/o SPF. Patient stated that she does notice improvement and happy with progress that she has seen so far.    The following portions of the chart were reviewed this encounter and updated as appropriate: medications, allergies, medical history  Review of Systems:  No other skin or systemic complaints except as noted in HPI or Assessment and Plan.  Objective  Well appearing patient in no apparent distress; mood and affect are within normal limits.   A focused examination was performed of the following areas: face   Relevant exam findings are noted in the Assessment and Plan.          Assessment & Plan   ACNE VULGARIS / ACNE SCARRING  Exam: Open comedones and inflammatory papules; scarring   Improving   Treatment Plan: - Increase Arazlo application to 2 nights a week - Continue applying Onexton QAM - Continue taking spirolactone nightly with dinner - Continue all OTC products as previously recommended ACNE VULGARIS   Related Medications spironolactone  (ALDACTONE ) 100 MG tablet Take 1 tablet (100 mg total) by mouth daily. ACNE SCARRING    Return in about 4 months (around 05/06/2024) for Acne with Dr. Alm .   Documentation: I have reviewed the above documentation for accuracy and completeness, and I agree with the above.  I, Shirron Maranda, CMA, am acting as scribe for:  Senia Even K, PA-C

## 2024-01-19 ENCOUNTER — Other Ambulatory Visit (HOSPITAL_COMMUNITY): Payer: Self-pay

## 2024-01-19 ENCOUNTER — Encounter (HOSPITAL_COMMUNITY): Payer: Self-pay

## 2024-01-19 MED ORDER — GABAPENTIN 300 MG PO CAPS
300.0000 mg | ORAL_CAPSULE | Freq: Three times a day (TID) | ORAL | 0 refills | Status: AC
  Filled 2024-01-19: qty 270, 90d supply, fill #0

## 2024-05-26 ENCOUNTER — Ambulatory Visit: Admitting: Dermatology
